# Patient Record
Sex: Male | Born: 2000 | Race: White | Hispanic: No | Marital: Single | Smoking: Former smoker
Health system: Southern US, Community
[De-identification: ages and names within clinical notes are randomized; demographics above are authoritative.]

## PROBLEM LIST (undated history)

## (undated) DIAGNOSIS — J45909 Unspecified asthma, uncomplicated: Secondary | ICD-10-CM

## (undated) HISTORY — PX: CIRCUMCISION: SHX1350

---
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
## 2021-06-28 ENCOUNTER — Other Ambulatory Visit: Payer: Self-pay | Admitting: Orthopedic Surgery

## 2021-06-28 ENCOUNTER — Other Ambulatory Visit: Payer: Self-pay | Admitting: Hematology and Oncology

## 2021-06-28 DIAGNOSIS — G8929 Other chronic pain: Secondary | ICD-10-CM

## 2021-07-14 ENCOUNTER — Other Ambulatory Visit: Payer: Self-pay

## 2021-07-14 ENCOUNTER — Ambulatory Visit
Admission: RE | Admit: 2021-07-14 | Discharge: 2021-07-14 | Disposition: A | Discharge status: Home / Self Care | Payer: 59 | Source: Ambulatory Visit | Attending: Orthopedic Surgery | Admitting: Orthopedic Surgery

## 2021-07-14 DIAGNOSIS — G8929 Other chronic pain: Secondary | ICD-10-CM

## 2021-07-14 DIAGNOSIS — M25562 Pain in left knee: Secondary | ICD-10-CM

## 2021-07-30 ENCOUNTER — Other Ambulatory Visit: Payer: Self-pay | Admitting: Nurse Practitioner

## 2021-07-30 ENCOUNTER — Other Ambulatory Visit: Payer: Self-pay

## 2021-07-30 ENCOUNTER — Ambulatory Visit
Admission: RE | Admit: 2021-07-30 | Discharge: 2021-07-30 | Disposition: A | Discharge status: Home / Self Care | Payer: No Typology Code available for payment source | Source: Ambulatory Visit | Attending: Nurse Practitioner | Admitting: Nurse Practitioner

## 2021-07-30 DIAGNOSIS — Z021 Encounter for pre-employment examination: Secondary | ICD-10-CM

## 2021-11-05 ENCOUNTER — Emergency Department (HOSPITAL_BASED_OUTPATIENT_CLINIC_OR_DEPARTMENT_OTHER): Payer: No Typology Code available for payment source

## 2021-11-05 ENCOUNTER — Other Ambulatory Visit: Payer: Self-pay

## 2021-11-05 ENCOUNTER — Emergency Department (HOSPITAL_BASED_OUTPATIENT_CLINIC_OR_DEPARTMENT_OTHER)
Admission: EM | Admit: 2021-11-05 | Discharge: 2021-11-05 | Disposition: A | Discharge status: Home / Self Care | Payer: No Typology Code available for payment source | Attending: Emergency Medicine | Admitting: Emergency Medicine

## 2021-11-05 ENCOUNTER — Encounter (HOSPITAL_BASED_OUTPATIENT_CLINIC_OR_DEPARTMENT_OTHER): Payer: Self-pay | Admitting: Pediatrics

## 2021-11-05 DIAGNOSIS — S0083XA Contusion of other part of head, initial encounter: Secondary | ICD-10-CM | POA: Insufficient documentation

## 2021-11-05 DIAGNOSIS — J45909 Unspecified asthma, uncomplicated: Secondary | ICD-10-CM | POA: Diagnosis not present

## 2021-11-05 DIAGNOSIS — W228XXA Striking against or struck by other objects, initial encounter: Secondary | ICD-10-CM | POA: Insufficient documentation

## 2021-11-05 DIAGNOSIS — S060X0A Concussion without loss of consciousness, initial encounter: Secondary | ICD-10-CM | POA: Insufficient documentation

## 2021-11-05 DIAGNOSIS — S0990XA Unspecified injury of head, initial encounter: Secondary | ICD-10-CM | POA: Diagnosis present

## 2021-11-05 MED ORDER — ACETAMINOPHEN 325 MG PO TABS
650.0000 mg | ORAL_TABLET | Freq: Once | ORAL | Status: AC
  Administered 2021-11-05: 650 mg via ORAL
  Filled 2021-11-05: qty 2

## 2021-11-05 NOTE — Discharge Instructions (Signed)
You were seen in the ER today for your headache and head injury. You likely have a concussion. You may take Tylenol 650 mg every 4 hours as needed for headache. It is very important that you rest over the next 48 hours. You should also not participate in any exercise or sports for at least 7 days and until you have no symptoms including headache, dizziness, nausea, or vomiting. Once your symptoms have resolved and you have gone at least 7 days following your injury, you may gradually return to light exercise as directed by your regular primary care provider. Return to the emergency department for repeat evaluation if you develop more than 2 episodes of vomiting, worsening headache despite use of Tylenol, difficulties with speech or balance, or other new concerns. ? ?
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
## 2021-11-05 NOTE — ED Notes (Signed)
Patient transported ambulatory to CT ?
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
## 2021-11-05 NOTE — ED Notes (Signed)
RN provided AVS using Teachback Method. Patient verbalizes understanding of Discharge Instructions. Opportunity for Questioning and Answers were provided by RN. Patient Discharged from ED ambulatory to Home with family.  

## 2023-02-03 ENCOUNTER — Emergency Department (HOSPITAL_BASED_OUTPATIENT_CLINIC_OR_DEPARTMENT_OTHER): Payer: 59 | Admitting: Radiology

## 2023-02-03 ENCOUNTER — Emergency Department (HOSPITAL_BASED_OUTPATIENT_CLINIC_OR_DEPARTMENT_OTHER)
Admission: EM | Admit: 2023-02-03 | Discharge: 2023-02-03 | Disposition: A | Discharge status: Home / Self Care | Payer: 59 | Attending: Emergency Medicine | Admitting: Emergency Medicine

## 2023-02-03 ENCOUNTER — Encounter (HOSPITAL_BASED_OUTPATIENT_CLINIC_OR_DEPARTMENT_OTHER): Payer: Self-pay

## 2023-02-03 ENCOUNTER — Other Ambulatory Visit: Payer: Self-pay

## 2023-02-03 DIAGNOSIS — M25511 Pain in right shoulder: Secondary | ICD-10-CM | POA: Diagnosis present

## 2023-02-03 DIAGNOSIS — S43401A Unspecified sprain of right shoulder joint, initial encounter: Secondary | ICD-10-CM | POA: Insufficient documentation

## 2023-02-03 DIAGNOSIS — Y92410 Unspecified street and highway as the place of occurrence of the external cause: Secondary | ICD-10-CM | POA: Insufficient documentation
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
## 2023-02-03 NOTE — Discharge Instructions (Signed)
You have been seen today for your complaint of right shoulder pain. Your imaging was reassuring. Your discharge medications include Alternate tylenol and ibuprofen for pain. You may alternate these every 4 hours. You may take up to 800 mg of ibuprofen at a time and up to 1000 mg of tylenol. Home care instructions are as follows:   Rest and ice the shoulder multiple times per day Follow up with: Dr. Shon Baton. He is an Investment banker, operational. Call to schedule a follow up visit. Please seek immediate medical care if you develop any of the following symptoms: You cannot move your arm or shoulder. You develop severe numbness or tingling in your arm, hand, or fingers. Your arm, hand, or fingers feel cold and turn blue, white, or gray. At this time there does not appear to be the presence of an emergent medical condition, however there is always the potential for conditions to change. Please read and follow the below instructions.  Do not take your medicine if  develop an itchy rash, swelling in your mouth or lips, or difficulty breathing; call 911 and seek immediate emergency medical attention if this occurs.  You may review your lab tests and imaging results in their entirety on your MyChart account.  Please discuss all results of fully with your primary care provider and other specialist at your follow-up visit.  Note: Portions of this text may have been transcribed using voice recognition software. Every effort was made to ensure accuracy; however, inadvertent computerized transcription errors may still be present.

## 2023-02-03 NOTE — ED Notes (Signed)
Discharge paperwork given and verbally understood. 

## 2023-08-13 IMAGING — CR DG CHEST 1V
1 series · 1 of 1 positions shown · non-contrast
Comparison: Chest x-ray 05/30/2020.

CLINICAL DATA: 20-year-old male under pre-employment evaluation for
the fire department.

EXAM:
CHEST  1 VIEW

[w chest pa]
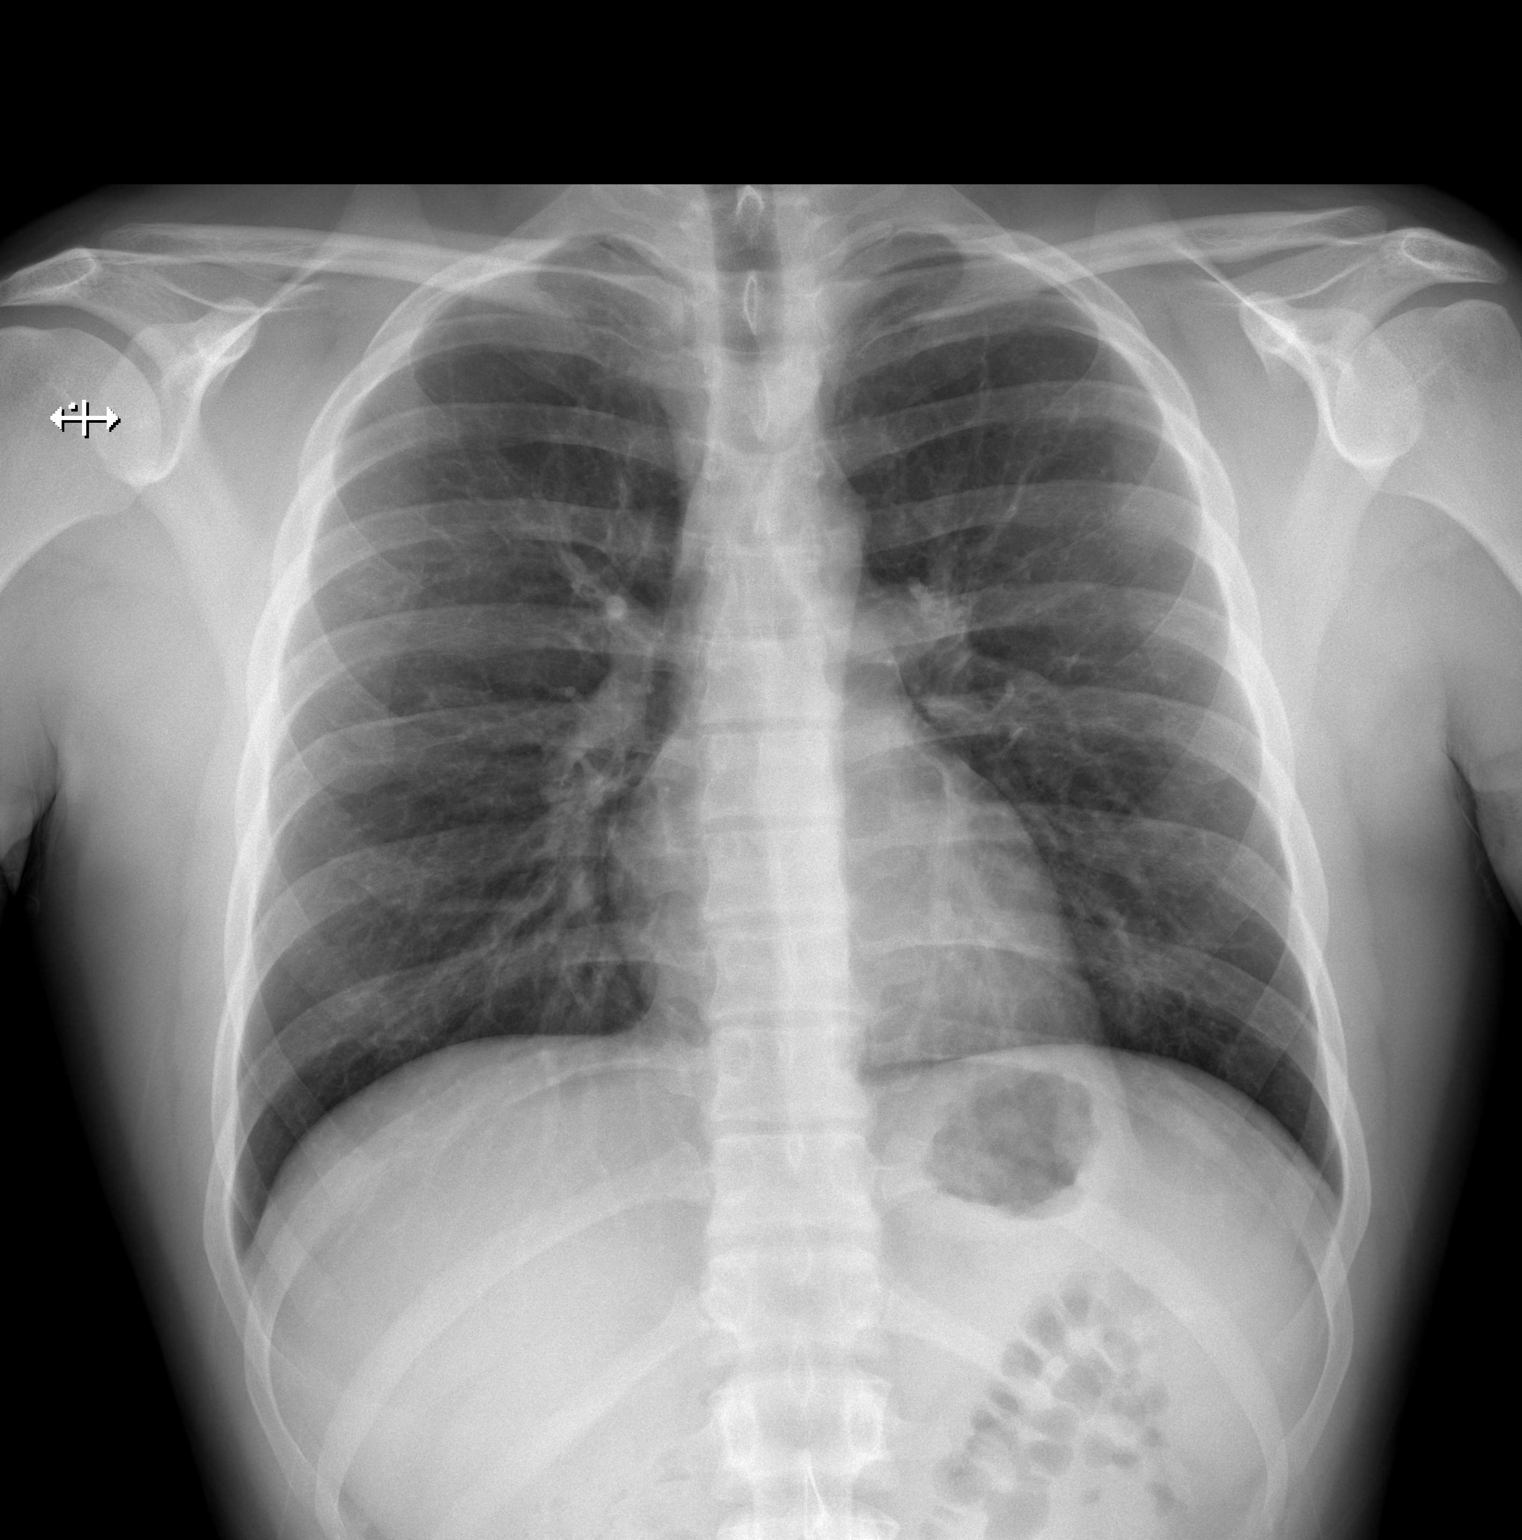

[1 of 1 positions shown; findings below may reference images not displayed]

FINDINGS: Lung volumes are normal. No consolidative airspace disease. No
pleural effusions. No pneumothorax. No pulmonary nodule or mass
noted. Pulmonary vasculature and the cardiomediastinal silhouette
are within normal limits.
IMPRESSION: No radiographic evidence of acute cardiopulmonary disease.

## 2023-10-13 ENCOUNTER — Other Ambulatory Visit (HOSPITAL_COMMUNITY): Payer: Self-pay | Admitting: Orthopedic Surgery

## 2023-10-13 ENCOUNTER — Encounter (HOSPITAL_BASED_OUTPATIENT_CLINIC_OR_DEPARTMENT_OTHER): Payer: Self-pay

## 2023-10-13 ENCOUNTER — Ambulatory Visit (HOSPITAL_BASED_OUTPATIENT_CLINIC_OR_DEPARTMENT_OTHER): Admission: RE | Admit: 2023-10-13 | Payer: 59 | Source: Ambulatory Visit

## 2023-10-13 DIAGNOSIS — M79671 Pain in right foot: Secondary | ICD-10-CM

## 2024-03-18 ENCOUNTER — Emergency Department

## 2024-03-18 ENCOUNTER — Other Ambulatory Visit: Payer: Self-pay

## 2024-03-18 ENCOUNTER — Encounter: Payer: Self-pay | Admitting: *Deleted

## 2024-03-18 ENCOUNTER — Emergency Department
Admission: EM | Admit: 2024-03-18 | Discharge: 2024-03-18 | Disposition: A | Discharge status: Home / Self Care | Attending: Emergency Medicine | Admitting: Emergency Medicine

## 2024-03-18 DIAGNOSIS — S61213S Laceration without foreign body of left middle finger without damage to nail, sequela: Secondary | ICD-10-CM

## 2024-03-18 DIAGNOSIS — W28XXXA Contact with powered lawn mower, initial encounter: Secondary | ICD-10-CM | POA: Diagnosis not present

## 2024-03-18 DIAGNOSIS — S6992XA Unspecified injury of left wrist, hand and finger(s), initial encounter: Secondary | ICD-10-CM | POA: Diagnosis present

## 2024-03-18 DIAGNOSIS — S62633B Displaced fracture of distal phalanx of left middle finger, initial encounter for open fracture: Secondary | ICD-10-CM | POA: Diagnosis not present

## 2024-03-18 DIAGNOSIS — S61215A Laceration without foreign body of left ring finger without damage to nail, initial encounter: Secondary | ICD-10-CM | POA: Diagnosis not present

## 2024-03-18 DIAGNOSIS — S61213A Laceration without foreign body of left middle finger without damage to nail, initial encounter: Secondary | ICD-10-CM | POA: Insufficient documentation

## 2024-03-18 DIAGNOSIS — S62639B Displaced fracture of distal phalanx of unspecified finger, initial encounter for open fracture: Secondary | ICD-10-CM

## 2024-03-18 MED ORDER — LIDOCAINE HCL (PF) 1 % IJ SOLN
5.0000 mL | Freq: Once | INTRAMUSCULAR | Status: AC
  Administered 2024-03-18: 5 mL
  Filled 2024-03-18: qty 5

## 2024-03-18 MED ORDER — CEPHALEXIN 500 MG PO CAPS
500.0000 mg | ORAL_CAPSULE | Freq: Once | ORAL | Status: AC
  Administered 2024-03-18: 500 mg via ORAL
  Filled 2024-03-18: qty 1

## 2024-03-18 MED ORDER — HYDROCODONE-ACETAMINOPHEN 5-325 MG PO TABS
2.0000 | ORAL_TABLET | Freq: Once | ORAL | Status: AC
  Administered 2024-03-18: 2 via ORAL
  Filled 2024-03-18: qty 2

## 2024-03-18 MED ORDER — IBUPROFEN 600 MG PO TABS: 600.0000 mg | ORAL_TABLET | Freq: Three times a day (TID) | ORAL | 0 refills | Status: AC | PRN

## 2024-03-18 MED ORDER — HYDROCODONE-ACETAMINOPHEN 5-325 MG PO TABS: 1.0000 | ORAL_TABLET | Freq: Three times a day (TID) | ORAL | 0 refills | Status: AC | PRN

## 2024-03-18 MED ORDER — CEPHALEXIN 500 MG PO CAPS: 500.0000 mg | ORAL_CAPSULE | Freq: Three times a day (TID) | ORAL | 0 refills | Status: AC

## 2024-03-18 NOTE — ED Triage Notes (Signed)
 Pt ambulatory to triage.  Pt has laceration to left 3rd and 4th fingers.  Pt was working on a lawnmower and cut the tips of fingers.  Bleeding controlled  pt alert.

## 2024-03-18 NOTE — ED Provider Notes (Signed)
 [facility] Provider Note    Event Date/Time   First MD Initiated Contact with Patient 03/18/24 2106     (approximate)   History   Laceration (/)    HPI  Brett Willis is a 23 y.o. male     with no significant past medical history who presents to the ED complaining of laceration. According to the patient, patient was working on a lawnmower and cut his left 3rd and 4th fingers.  Tdap lest than 5 years.  Patient is here with his mother    Patient Active Problem List   Diagnosis Date Noted   Dark stools 11/23/2018   Poor concentration 08/07/2018   Frequent headaches 01/10/2016   History of multiple concussions 01/10/2016   Anxiety state 01/10/2016     ROS: Patient currently denies any vision changes, tinnitus, difficulty speaking, facial droop, sore throat, chest pain, shortness of breath, abdominal pain, nausea/vomiting/diarrhea, dysuria, or weakness/numbness/paresthesias in any extremity   Physical Exam   Triage Vital Signs: ED Triage Vitals  Encounter Vitals Group     BP 03/18/24 1927 (!) 144/100     Girls Systolic BP Percentile --      Girls Diastolic BP Percentile --      Boys Systolic BP Percentile --      Boys Diastolic BP Percentile --      Pulse Rate 03/18/24 1927 79     Resp 03/18/24 1927 20     Temp 03/18/24 1927 98.4 F (36.9 C)     Temp Source 03/18/24 1927 Oral     SpO2 03/18/24 1927 100 %     Weight 03/18/24 1926 170 lb (77.1 kg)     Height 03/18/24 1926 5' 8 (1.727 m)     Head Circumference --      Peak Flow --      Pain Score 03/18/24 1926 10     Pain Loc --      Pain Education --      Exclude from Growth Chart --     Most recent vital signs: Vitals:   03/18/24 1927 03/18/24 2238  BP: (!) 144/100 (!) 130/90  Pulse: 79 78  Resp: 20 20  Temp: 98.4 F (36.9 C) (!) 97.4 F (36.3 C)  SpO2: 100% 98%     Physical Exam Vitals and nursing note reviewed.  In triage patient was hypertensive  Constitutional:       General: Awake and alert. No acute distress.    Appearance: Normal appearance. The patient is normal weight.      Able to speak in complete sentences without cough or dyspnea  HENT:     Head: Normocephalic and atraumatic.     Mouth: Mucous membranes are moist.  Eyes:     General: PERRL. Normal EOMs          Conjunctiva/sclera: Conjunctivae normal.  Nose No congestion/rhinorrhea  CV:                  Good peripheral perfusion.  Regular rate and rhythm  Resp:               Normal effort.  Equal breath sounds bilaterally.  Abd:                 No distention.  Soft, nontender.  No rebound or guarding.  Musculoskeletal:        General: No swelling. Normal range of motion Left hand third finger: Avulsion of the skin tip  of the finger.  Nail is intact.  Mild active bleeding.  No signs of infection. Left hand fourth finger: Laceration on the tip of the finger.  Nail is intact.  Mild bleeding.  No signs of infection  Skin:    General: Skin is warm and dry.     Capillary Refill: Capillary refill takes less than 2 seconds.     Findings: No rash.  Neurological:     Mental Status: The patient is awake and alert. MAE spontaneously. No gross focal neurologic deficits are appreciated.  Psychiatric Mood and affect are normal. Speech and behavior are normal.   ED Results / Procedures / Treatments   Labs (all labs ordered are listed, but only abnormal results are displayed) Labs Reviewed - No data to display   EKG  RADIOLOGY     PROCEDURES:  Critical Care performed:   Procedures   MEDICATIONS ORDERED IN ED: Medications  lidocaine  (PF) (XYLOCAINE ) 1 % injection 5 mL (has no administration in time range)  cephALEXin  (KEFLEX ) capsule 500 mg (has no administration in time range)  HYDROcodone -acetaminophen  (NORCO/VICODIN) 5-325 MG per tablet 2 tablet (2 tablets Oral Given 03/18/24 2231)   Clinical Course as of 03/18/24 2342  Thu Mar 18, 2024  2325 DG Hand Complete Left Soft tissue  avulsion involving the distal third digit with associated distal tuft fracture. 2. Suspected soft tissue avulsion involving the distal fourth digit, without osseous abnormality.   [AE]    Clinical Course User Index [AE] Janit Kast, PA-C    IMPRESSION / MDM / ASSESSMENT AND PLAN / ED COURSE  I reviewed the triage vital signs and the nursing notes.  Differential diagnosis includes, but is not limited to, avulsion of the skin, laceration, fracture  Patient's presentation is most consistent with acute complicated illness / injury requiring diagnostic workup.     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  At physical exam there is evidence of avulsion of the skin in the tip of the third left finger with intact nail.  A laceration of the tip of the fourth finger Patient's diagnosis is consistent with laceration of left 3rd and 4th fingers, distal third digit tuft open fracture.  I independently reviewed and interpreted imaging and agree with radiologists findings.  I did not order any labs, physical exam is reassuring. I did review the patient's allergies and medications.Tdap is up-to-date. The patient is in stable and satisfactory condition for discharge home  Patient will be discharged home with prescriptions for ibuprofen , Keflex  Norco.. Patient is to follow up with PCP and wound center as needed or otherwise directed. Patient is given ED precautions to return to the ED for any worsening or new symptoms. Discussed plan of care with patient, answered all of patient's questions, Patient agreeable to plan of care. Advised patient to take medications according to the instructions on the label. Discussed possible side effects of new medications. Patient verbalized understanding.   FINAL CLINICAL IMPRESSION(S) / ED DIAGNOSES   Final diagnoses:  Laceration of left middle finger without foreign body without  damage to nail, sequela  Laceration of left ring finger without foreign body without damage to nail, initial encounter  Open fracture of tuft of distal phalanx of finger     Rx / DC Orders   ED Discharge Orders          Ordered    cephALEXin  (KEFLEX ) 500  MG capsule  3 times daily        03/18/24 2334    ibuprofen  (ADVIL ) 600 MG tablet  Every 8 hours PRN        03/18/24 2334    HYDROcodone -acetaminophen  (NORCO/VICODIN) 5-325 MG tablet  3 times daily PRN        03/18/24 2341             Note:  This document was prepared using Dragon voice recognition software and may include unintentional dictation errors.   Janit Kast, PA-C 03/18/24 2342    Willo Dunnings, MD 03/19/24 [id number]

## 2024-03-18 NOTE — Discharge Instructions (Addendum)
 You have been diagnosed with laceration of the left 3rd and 4th finger, third digit distal tuft open fracture.  Do not submerge your hand in water.  Please call the wound center and make an appointment for Monday.  You can take ibuprofen  600 mg every 8 hours as needed for pain.  Please take Keflex  1 tablet by mouth every 8 hours for 7 days .Please come back to ED or go to your PCP if symptoms or symptoms worsen.

## 2024-03-22 NOTE — Progress Notes (Signed)
 Brett Willis is a 23 y.o. male who is a new patient in clinic to establish care.  Patient presents as new patient for annual exam as well as wound care needs. 3 days ago he went to ER after he reached under a lawn mower and lacerated the tips of his left, 3rd and 4th, digits with avulsion fraction of the 3rd. Wound care was recommended but when he called they informed he would need a referral and can't get him in until September. Patient notes some pain but would like to return to work as well as obtain wound care instructions.  Upon further history, patient notes he has 13% disability with history of right bicep repair but is cleared for full duty related to work. He also notes a history of at least 11 concussions (both sustained in athletics as well as recreationally). He has persistent headaches in different locations, sometimes multiple times weekly, and primarily in the right front of his head. He notes light sensitivity. Describes as occasionally throbbing and other times persistent pressure. He'll take extra strength acetaminophen  which is somewhat beneficial.   Hx of gastritis, avoids ibuprofen .  Uses nicotine vape and pouches daily. Unknown quantity.   Patient denies concern, other than wound, that he wants addressed today.     Preventive Care:  - Dentist: patient notes last visit was about 8 years ago. Discussed recommendations and routine hygiene as patient notes bleeding with brushing which does not occur regularly.   - Ophthalmology: contacts, annual visits, no concerns  - Colonoscopy: no family hx, defer  - Exercise: defer  - Healthy food choices: defer  - Tdap: up to date  Patient Active Problem List  Diagnosis  . Frequent headaches  . History of concussion  . Annual physical exam    No past medical history on file.  Past Surgical History:  Procedure Laterality Date  . REPAIR TENDON/MUSCLE BICEPS Right 2024    Family History  Problem Relation Name Age of Onset  .  No Known Problems Mother    . No Known Problems Father    . No Known Problems Maternal Grandmother    . No Known Problems Maternal Grandfather    . No Known Problems Paternal Grandmother    . No Known Problems Paternal Grandfather      Social History   Socioeconomic History  . Marital status: Single  . Number of children: 1  Tobacco Use  . Smoking status: Never  . Smokeless tobacco: Current  Vaping Use  . Vaping status: Every Day  Substance and Sexual Activity  . Alcohol use: Never  . Drug use: Never  . Sexual activity: Yes    Partners: Female   Social Drivers of Health   Housing Stability: Unknown (03/22/2024)   Housing Stability Vital Sign   . Homeless in the Last Year: No    Current Outpatient Medications  Medication Sig Dispense Refill  . cephalexin  (KEFLEX ) 500 MG capsule Take 500 mg by mouth 3 (three) times daily    . ibuprofen  (MOTRIN ) 600 MG tablet Take 600 mg by mouth every 6 (six) hours as needed     No current facility-administered medications for this visit.    No Known Allergies  No results found for this or any previous visit.  BP Readings from Last 3 Encounters:  03/22/24 130/80    Wt Readings from Last 3 Encounters:  03/22/24 80.5 kg (177 lb 6.4 oz)    Body mass index is 26.78 kg/m.   Review  of Systems:  Review of Systems  Constitutional:  Negative for diaphoresis, malaise/fatigue and weight loss.  Eyes:  Negative for blurred vision, double vision and photophobia.  Respiratory:  Negative for cough, shortness of breath and wheezing.   Cardiovascular:  Negative for chest pain, palpitations and leg swelling.  Gastrointestinal:  Negative for constipation and diarrhea.  Musculoskeletal:  Negative for joint pain and myalgias.  Skin:  Negative for itching and rash.  Neurological:  Positive for headaches. Negative for dizziness.  Psychiatric/Behavioral:  The patient does not have insomnia.       Physical Exam:  Vitals:   03/22/24 1354   BP: 130/80  Pulse: 84  SpO2: 99%  Weight: 80.5 kg (177 lb 6.4 oz)  Height: 173.4 cm (5' 8.25)    Physical Exam Constitutional:      General: He is not in acute distress.    Appearance: Normal appearance.  HENT:     Head: Normocephalic and atraumatic.     Right Ear: Tympanic membrane, ear canal and external ear normal.     Left Ear: Tympanic membrane, ear canal and external ear normal.     Nose: Nose normal.     Mouth/Throat:     Lips: Pink.     Mouth: Mucous membranes are moist.     Tongue: Tongue does not deviate from midline.     Pharynx: Oropharynx is clear. Uvula midline.  Eyes:     General: Lids are normal. Vision grossly intact. Gaze aligned appropriately.     Extraocular Movements: Extraocular movements intact.     Pupils: Pupils are equal, round, and reactive to light.  Cardiovascular:     Rate and Rhythm: Normal rate and regular rhythm.     Heart sounds: S1 normal and S2 normal.  Pulmonary:     Effort: Pulmonary effort is normal.     Breath sounds: Normal breath sounds.  Musculoskeletal:     Cervical back: Full passive range of motion without pain.     Right lower leg: No edema.     Left lower leg: No edema.  Lymphadenopathy:     Cervical: No cervical adenopathy.  Neurological:     General: No focal deficit present.     Mental Status: He is alert and oriented to person, place, and time.  Psychiatric:        Mood and Affect: Mood and affect normal.        Behavior: Behavior is cooperative.          Assessment/Plan:  Annual physical exam Reviewed routine screenings and recommendations. Patient fasting, screening labs to be obtained today. Discussed healthy lifestyle and safety.   Laceration of finger of left hand without foreign body with damage to nail Dressing removed today, required soaking due to dried blood. Healing wound with dark purple/black hard discharge circumferential to wounds. Question dried blood versus necrotic skin. No apparent  infection. On Keflex . Cleansed with saline, applied topical antibiotic ointment, dressed. Refer to wound care. See pictures under media.   Frequent headaches Responding to OTC but frequent and severe in nature. Encouraged follow up for further discussion and treatment options.     Follow-up with results     Margaretann Seip, NP

## 2024-04-07 ENCOUNTER — Ambulatory Visit: Admitting: Physician Assistant

## 2024-04-20 ENCOUNTER — Ambulatory Visit: Admitting: Physician Assistant

## 2024-07-22 ENCOUNTER — Emergency Department (HOSPITAL_COMMUNITY)

## 2024-07-22 ENCOUNTER — Emergency Department (HOSPITAL_COMMUNITY): Admission: EM | Admit: 2024-07-22 | Discharge: 2024-07-22 | Disposition: A | Discharge status: Home / Self Care

## 2024-07-22 DIAGNOSIS — R531 Weakness: Secondary | ICD-10-CM

## 2024-07-22 DIAGNOSIS — R519 Headache, unspecified: Secondary | ICD-10-CM | POA: Diagnosis not present

## 2024-07-22 DIAGNOSIS — R410 Disorientation, unspecified: Secondary | ICD-10-CM

## 2024-07-22 DIAGNOSIS — E876 Hypokalemia: Secondary | ICD-10-CM | POA: Diagnosis not present

## 2024-07-22 DIAGNOSIS — R4701 Aphasia: Secondary | ICD-10-CM | POA: Diagnosis not present

## 2024-07-22 DIAGNOSIS — G43501 Persistent migraine aura without cerebral infarction, not intractable, with status migrainosus: Secondary | ICD-10-CM | POA: Diagnosis present

## 2024-07-22 LAB — COMPREHENSIVE METABOLIC PANEL WITH GFR
ALT: 24 U/L (ref 0–44)
AST: 28 U/L (ref 15–41)
Albumin: 4.6 g/dL (ref 3.5–5.0)
Alkaline Phosphatase: 87 U/L (ref 38–126)
Anion gap: 14 (ref 5–15)
BUN: 9 mg/dL (ref 6–20)
CO2: 22 mmol/L (ref 22–32)
Calcium: 8.9 mg/dL (ref 8.9–10.3)
Chloride: 101 mmol/L (ref 98–111)
Creatinine, Ser: 1.14 mg/dL (ref 0.61–1.24)
GFR, Estimated: >60 mL/min (ref >60)
Glucose, Bld: 108 mg/dL — ABNORMAL HIGH (ref 70–99)
Potassium: 3.3 mmol/L — ABNORMAL LOW (ref 3.5–5.1)
Sodium: 137 mmol/L (ref 135–145)
Total Bilirubin: 1.4 mg/dL — ABNORMAL HIGH (ref 0.0–1.2)
Total Protein: 7.1 g/dL (ref 6.5–8.1)

## 2024-07-22 LAB — URINALYSIS, ROUTINE W REFLEX MICROSCOPIC
Bilirubin Urine: NEGATIVE
Glucose, UA: NEGATIVE mg/dL
Hgb urine dipstick: NEGATIVE
Ketones, ur: 20 mg/dL — AB
Leukocytes,Ua: NEGATIVE
Nitrite: NEGATIVE
Protein, ur: NEGATIVE mg/dL
Specific Gravity, Urine: 1.045 — ABNORMAL HIGH (ref 1.005–1.030)
pH: 6 (ref 5.0–8.0)

## 2024-07-22 LAB — RAPID URINE DRUG SCREEN, HOSP PERFORMED
Amphetamines: NOT DETECTED
Barbiturates: NOT DETECTED
Benzodiazepines: NOT DETECTED
Cocaine: NOT DETECTED
Opiates: NOT DETECTED
Tetrahydrocannabinol: NOT DETECTED

## 2024-07-22 LAB — DIFFERENTIAL
Abs Immature Granulocytes: 0.03 K/uL (ref 0.00–0.07)
Basophils Absolute: 0.1 K/uL (ref 0.0–0.1)
Basophils Relative: 1 %
Eosinophils Absolute: 0 K/uL (ref 0.0–0.5)
Eosinophils Relative: 0 %
Immature Granulocytes: 0 %
Lymphocytes Relative: 14 %
Lymphs Abs: 1.1 K/uL (ref 0.7–4.0)
Monocytes Absolute: 0.5 K/uL (ref 0.1–1.0)
Monocytes Relative: 7 %
Neutro Abs: 5.7 K/uL (ref 1.7–7.7)
Neutrophils Relative %: 78 %

## 2024-07-22 LAB — CBC
HCT: 47.2 % (ref 39.0–52.0)
Hemoglobin: 16.9 g/dL (ref 13.0–17.0)
MCH: 30.2 pg (ref 26.0–34.0)
MCHC: 35.8 g/dL (ref 30.0–36.0)
MCV: 84.3 fL (ref 80.0–100.0)
Platelets: 312 K/uL (ref 150–400)
RBC: 5.6 MIL/uL (ref 4.22–5.81)
RDW: 11.5 % (ref 11.5–15.5)
WBC: 7.4 K/uL (ref 4.0–10.5)
nRBC: 0 % (ref 0.0–0.2)

## 2024-07-22 LAB — I-STAT CHEM 8, ED
BUN: 8 mg/dL (ref 6–20)
Calcium, Ion: 0.99 mmol/L — ABNORMAL LOW (ref 1.15–1.40)
Chloride: 101 mmol/L (ref 98–111)
Creatinine, Ser: 1.2 mg/dL (ref 0.61–1.24)
Glucose, Bld: 105 mg/dL — ABNORMAL HIGH (ref 70–99)
HCT: 47 % (ref 39.0–52.0)
Hemoglobin: 16 g/dL (ref 13.0–17.0)
Potassium: 3.3 mmol/L — ABNORMAL LOW (ref 3.5–5.1)
Sodium: 139 mmol/L (ref 135–145)
TCO2: 24 mmol/L (ref 22–32)

## 2024-07-22 LAB — CBG MONITORING, ED: Glucose-Capillary: 110 mg/dL — ABNORMAL HIGH (ref 70–99)

## 2024-07-22 LAB — PROTIME-INR
INR: 1.2 (ref 0.8–1.2)
Prothrombin Time: 16 s — ABNORMAL HIGH (ref 11.4–15.2)

## 2024-07-22 LAB — APTT: aPTT: 31 s (ref 24–36)

## 2024-07-22 LAB — ETHANOL: Alcohol, Ethyl (B): <15 mg/dL (ref <15)

## 2024-07-22 MED ORDER — SODIUM CHLORIDE 0.9% FLUSH
3.0000 mL | Freq: Once | INTRAVENOUS | Status: AC
  Administered 2024-07-22: 3 mL via INTRAVENOUS

## 2024-07-22 MED ORDER — IOHEXOL 350 MG/ML SOLN
100.0000 mL | Freq: Once | INTRAVENOUS | Status: AC | PRN
  Administered 2024-07-22: 100 mL via INTRAVENOUS

## 2024-07-22 MED ORDER — LACTATED RINGERS IV BOLUS
1000.0000 mL | Freq: Once | INTRAVENOUS | Status: AC
  Administered 2024-07-22: 1000 mL via INTRAVENOUS

## 2024-07-22 MED ORDER — PROCHLORPERAZINE EDISYLATE 10 MG/2ML IJ SOLN
10.0000 mg | Freq: Once | INTRAMUSCULAR | Status: AC
  Administered 2024-07-22: 10 mg via INTRAVENOUS
  Filled 2024-07-22: qty 2

## 2024-07-22 MED ORDER — DIPHENHYDRAMINE HCL 50 MG/ML IJ SOLN
50.0000 mg | Freq: Once | INTRAMUSCULAR | Status: AC
  Administered 2024-07-22: 50 mg via INTRAVENOUS
  Filled 2024-07-22: qty 1

## 2024-07-22 NOTE — Code Documentation (Signed)
 Responded to Code Stroke called at 1911 for R sided weakness, R facial droop, and aphasia, LSN-1430. Pt arrived at 1930, CBG-110, NIH-3 for LOC questions. RLE drift, and aphasia. CT head negative for acute changes. CTA/CTP-no LVO. TNK not given-pt outside window. Plan-migraine cocktail. Please complete VS/neuro checks(including NIH) q2h x 12h, then q4h.

## 2024-07-22 NOTE — ED Provider Notes (Signed)
 [place] EMERGENCY DEPARTMENT AT [facility] Provider Note   CSN: [id number] Arrival date & time: 07/22/24  8069  An emergency department physician performed an initial assessment on this suspected stroke patient at 67.  Patient presents with: Code Stroke   Brett Willis is a 23 y.o. male. Hx of anxiety presenting with code stroke with right-sided hemiparesis, aphasia starting at 2:30 PM today.  History per EMS, some history from patient.  Last known well was 2:30 PM.  This was reported from family.  Reports right-sided hemiparesis, right-sided facial droop, and aphasia.  With EMS no medications given, facial droop has resolved, weakness has improved in the right upper and lower extremity.  Aphasia is also resolved, patient is able to answer some questions however very slow to response.  Denies any episodes of vomiting.  Patient endorses he developed a sudden onset headache today, and then developed a symptoms of right-sided facial droop and right-sided hemiparesis.  Sinew to have a headache on arrival.  Denies fever or recent illnesses.   HPI     Prior to Admission medications   Medication Sig Start Date End Date Taking? Authorizing Provider  amphetamine -dextroamphetamine (ADDERALL XR) 10 MG 24 hr capsule Take 1 capsule (10 mg total) by mouth daily. Take it in a.m. 08/07/18   Corinthia Blossom, MD  diclofenac  Sodium (VOLTAREN ) 1 % GEL Apply 2 g topically 4 (four) times daily. 09/13/20   Edelmiro Leash, MD  dicyclomine  (BENTYL ) 10 MG capsule Take 1 capsule (10 mg total) by mouth 4 (four) times daily -  before meals and at bedtime for 5 days. 05/31/20 06/05/20  Angelena Smalls, MD  metoCLOPramide  (REGLAN ) 10 MG tablet Take 1 tablet (10 mg total) by mouth every 8 (eight) hours as needed (headache and/or nausea). 05/31/20 05/31/21  Angelena Smalls, MD    Allergies: Patient has no known allergies.    Review of Systems  Updated Vital Signs BP 123/89   Pulse 86   Temp 98 F  (36.7 C) (Oral)   Resp 18   Wt 81 kg   SpO2 99%   BMI 27.15 kg/m   Physical Exam Vitals and nursing note reviewed.  Constitutional:      General: He is in acute distress.     Appearance: He is well-developed. He is ill-appearing.  HENT:     Head: Normocephalic and atraumatic.     Mouth/Throat:     Mouth: Mucous membranes are moist.     Pharynx: Oropharynx is clear.  Eyes:     Conjunctiva/sclera: Conjunctivae normal.  Cardiovascular:     Rate and Rhythm: Normal rate and regular rhythm.     Heart sounds: No murmur heard. Pulmonary:     Effort: Pulmonary effort is normal. No respiratory distress.     Breath sounds: Normal breath sounds.  Abdominal:     General: Abdomen is flat.     Palpations: Abdomen is soft.     Tenderness: There is no abdominal tenderness.  Musculoskeletal:        General: No swelling.     Cervical back: Neck supple.  Skin:    General: Skin is warm and dry.     Capillary Refill: Capillary refill takes less than 2 seconds.  Neurological:     Mental Status: He is alert and oriented to person, place, and time.     Cranial Nerves: No cranial nerve deficit.     Sensory: No sensory deficit.     Motor: Weakness present.  Coordination: Coordination normal.     Comments: 1+ pronator drift right lower extremity in comparison to left, 1+ aphasia.  NIH stroke scale of 2.  Psychiatric:        Mood and Affect: Mood normal.     (all labs ordered are listed, but only abnormal results are displayed) Labs Reviewed  PROTIME-INR - Abnormal; Notable for the following components:      Result Value   Prothrombin Time 16.0 (*)    All other components within normal limits  COMPREHENSIVE METABOLIC PANEL WITH GFR - Abnormal; Notable for the following components:   Potassium 3.3 (*)    Glucose, Bld 108 (*)    Total Bilirubin 1.4 (*)    All other components within normal limits  URINALYSIS, ROUTINE W REFLEX MICROSCOPIC - Abnormal; Notable for the following  components:   Specific Gravity, Urine 1.045 (*)    Ketones, ur 20 (*)    All other components within normal limits  I-STAT CHEM 8, ED - Abnormal; Notable for the following components:   Potassium 3.3 (*)    Glucose, Bld 105 (*)    Calcium, Ion 0.99 (*)    All other components within normal limits  CBG MONITORING, ED - Abnormal; Notable for the following components:   Glucose-Capillary 110 (*)    All other components within normal limits  APTT  CBC  DIFFERENTIAL  ETHANOL  RAPID URINE DRUG SCREEN, HOSP PERFORMED    EKG: None  Radiology: CT ANGIO HEAD NECK W WO CM W PERF (CODE STROKE) Result Date: 07/22/2024 CLINICAL DATA:  Initial evaluation for acute neuro deficit, stroke suspected. EXAM: CT ANGIOGRAPHY HEAD AND NECK CT PERFUSION BRAIN TECHNIQUE: Multidetector CT imaging of the head and neck was performed using the standard protocol during bolus administration of intravenous contrast. Multiplanar CT image reconstructions and MIPs were obtained to evaluate the vascular anatomy. Carotid stenosis measurements (when applicable) are obtained utilizing NASCET criteria, using the distal internal carotid diameter as the denominator. Multiphase CT imaging of the brain was performed following IV bolus contrast injection. Subsequent parametric perfusion maps were calculated using RAPID software. RADIATION DOSE REDUCTION: This exam was performed according to the departmental dose-optimization program which includes automated exposure control, adjustment of the mA and/or kV according to patient size and/or use of iterative reconstruction technique. CONTRAST:  OMNIPAQUE  IOHEXOL  350 MG/ML SOLN COMPARISON:  CT from earlier the same day. FINDINGS: CTA NECK FINDINGS Aortic arch: Standard branching. Imaged portion shows no evidence of aneurysm or dissection. No significant stenosis of the major arch vessel origins. Right carotid system: No evidence of dissection, stenosis (50% or greater) or occlusion.  Left carotid system: No evidence of dissection, stenosis (50% or greater) or occlusion. Vertebral arteries: No evidence of dissection, stenosis (50% or greater) or occlusion. Skeleton: No discrete or worrisome osseous lesions. Other neck: No other acute finding. Upper chest: No other acute finding. Review of the MIP images confirms the above findings CTA HEAD FINDINGS Anterior circulation: Both internal carotid arteries widely patent to the termini without stenosis. A1 segments widely patent. Normal anterior communicating artery complex. Both anterior cerebral arteries widely patent to their distal aspects without stenosis. No M1 stenosis or occlusion. Normal MCA bifurcations. Distal MCA branches well perfused and symmetric. Posterior circulation: Both V4 segments patent without significant stenosis. Both PICA patent at their origins. Basilar patent without stenosis. Superior cerebellar and posterior cerebral arteries patent bilaterally. Venous sinuses: Patent allowing for timing the contrast bolus. Anatomic variants: None significant.  No aneurysm.  Review of the MIP images confirms the above findings CT Brain Perfusion Findings: ASPECTS: 10 CBF (<30%) Volume: 0mL Perfusion (Tmax>6.0s) volume: 0mL Mismatch Volume: 0mL Infarction Location:Negative CT perfusion with no evidence for acute ischemia or other perfusion abnormality. IMPRESSION: 1. Negative CTA of the head and neck. No large vessel occlusion or other emergent finding. No hemodynamically significant or correctable stenosis. 2. Negative CT perfusion with no evidence for acute ischemia or other perfusion abnormality. These results were communicated to Dr. Arora at 8:02 pm on 07/22/2024 by text page via the [facility] messaging system. Electronically Signed   By: Morene Hoard M.D.   On: 07/22/2024 20:08   CT HEAD CODE STROKE WO CONTRAST Result Date: 07/22/2024 CLINICAL DATA:  Code stroke. Initial evaluation for acute neuro deficit, stroke suspected.  EXAM: CT HEAD WITHOUT CONTRAST TECHNIQUE: Contiguous axial images were obtained from the base of the skull through the vertex without intravenous contrast. RADIATION DOSE REDUCTION: This exam was performed according to the departmental dose-optimization program which includes automated exposure control, adjustment of the mA and/or kV according to patient size and/or use of iterative reconstruction technique. COMPARISON:  CT from 11/05/2021. FINDINGS: Brain: Cerebral volume within normal limits for patient age. No acute intracranial hemorrhage. No acute large vessel territory infarct. No mass lesion, midline shift, or mass effect. Ventricles are normal in size without hydrocephalus. No extra-axial fluid collection. Vascular: No abnormal hyperdense vessel. Skull: Scalp soft tissues demonstrate no acute abnormality. Calvarium intact. Sinuses/Orbits: Globes and orbital soft tissues within normal limits. Visualized paranasal sinuses are largely clear. No significant mastoid effusion. ASPECTS [facility] Stroke Program Early CT Score) - Ganglionic level infarction (caudate, lentiform nuclei, internal capsule, insula, M1-M3 cortex): 7 - Supraganglionic infarction (M4-M6 cortex): 3 Total score (0-10 with 10 being normal): 10 IMPRESSION: 1. Normal head CT.  No acute intracranial abnormality. 2. ASPECTS is 10. These results were communicated to Dr. Arora at 7:42 pm on 07/22/2024 by text page via the [facility] messaging system. Electronically Signed   By: Morene Hoard M.D.   On: 07/22/2024 19:46     Procedures   Medications Ordered in the ED  sodium chloride  flush (NS) 0.9 % injection 3 mL (3 mLs Intravenous Given 07/22/24 2057)  prochlorperazine  (COMPAZINE ) injection 10 mg (10 mg Intravenous Given 07/22/24 2048)  diphenhydrAMINE  (BENADRYL ) injection 50 mg (50 mg Intravenous Given 07/22/24 2049)  lactated ringers  bolus 1,000 mL (0 mLs Intravenous Stopped 07/22/24 2232)  iohexol  (OMNIPAQUE ) 350 MG/ML injection 100  mL (100 mLs Intravenous Contrast Given 07/22/24 1955)                                    Medical Decision Making Amount and/or Complexity of Data Reviewed Labs: ordered. Radiology: ordered.  Risk Prescription drug management.   Based on patient presentation, history, evaluation, high suspicion for complex migraine with associated initial concern for stroke, therefore code stroke was activated prior to arrival.  By time of presentation, patient with near complete resolution of symptoms, NIHSS 2 per discussion as well with neurology at bedside.  Imaging overall reassuring, low suspicion for intracranial bleed versus ischemic stroke versus LVO versus carotid dissection versus vertebral dissection.  Imaging is able to provide some evidence there is not appreciated central venous thrombosis.  After receiving imaging, patient received migraine cocktail, with complete resolution of symptoms including headache and any further neurologic deficits.  Discussed with neurology, agree that this is likely secondary  to a complex migraine, which patient also has a history of.  Recommended follow-up with neurology in outpatient setting.  Overall with complete resolution of symptoms with associated migraine cocktail, and patient overall clinically stable, hemodynamically stable, and afebrile throughout time in the ED, patient is overall stable for discharge.  Discussed tricked return precautions to ED, and recommendation to follow-up with neurology in outpatient setting.     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

## 2024-07-22 NOTE — Discharge Instructions (Signed)
 1 medication you  can try is  Excedrin.  He can purchase this over-the-counter.  This can sometimes do a better job of aborting migraines.   Please follow-up with neurology.  Sometimes migraines can be debilitating.  There are good medications out there that can help treat these migraines.  This is something to discuss with neurology as well as your primary care physician  If you have any kind of fever or chills, headaches, confusion, come back to the ED.  As we discussed, I have no concerns for meningitis or other infectious pathology but if you do develop a fever please come back.

## 2024-07-22 NOTE — Consult Note (Signed)
 NEUROLOGY CONSULT NOTE   Date of service: July 22, 2024 Patient Name: Brett Willis MRN:  [id number] DOB:  09-28-2000 Chief Complaint: Aphasia, confusion, right-sided numbness Requesting Provider: Simon Lavonia SAILOR, MD, Morene Barrio, MD  History of Present Illness  Brett Willis is a 23 y.o. male with initially provided history of no significant past medical history but later family arrives and gives history of migraines, presented for evaluation of confusion, aphasia, right-sided numbness and weakness. Last known well at 2:30 PM went to take a nap.  Family tried to wake him up and he appeared to be difficult to arouse.  He is complaining of a headache and was having difficulty getting his words out.  Also was somewhat weak on the right side and possibly had a facial droop on the right.  He also reported of 8 out of 10 headache. EMS was called, noticed aphasia and right-sided weakness and activated a code stroke. Speech was improving en route to the hospital  LKW: 1430 Modified rankin score: 0-Completely asymptomatic and back to baseline post- stroke IV Thrombolysis: Outside the window EVT: No ELVO  NIHSS components Score: Comment  1a Level of Conscious 0[x]  1[]  2[]  3[]      1b LOC Questions 0[]  1[x]  2[]       1c LOC Commands 0[x]  1[]  2[]       2 Best Gaze 0[x]  1[]  2[]       3 Visual 0[x]  1[]  2[]  3[]      4 Facial Palsy 0[x]  1[]  2[]  3[]      5a Motor Arm - left 0[x]  1[]  2[]  3[]  4[]  UN[]    5b Motor Arm - Right 0[x]  1[]  2[]  3[]  4[]  UN[]    6a Motor Leg - Left 0[x]  1[]  2[]  3[]  4[]  UN[]    6b Motor Leg - Right 0[]  1[x]  2[]  3[]  4[]  UN[]    7 Limb Ataxia 0[x]  1[]  2[]  UN[]      8 Sensory 0[x]  1[]  2[]  UN[]      9 Best Language 0[]  1[x]  2[]  3[]      10 Dysarthria 0[x]  1[]  2[]  UN[]      11 Extinct. and Inattention 0[x]  1[]  2[]       TOTAL: 3      ROS  Comprehensive ROS performed and pertinent positives documented in the HPI.  Past History   Past Medical History:  Diagnosis Date   Asthma      Past Surgical History:  Procedure Laterality Date   CIRCUMCISION      Family History: Family History  Problem Relation Age of Onset   Migraines Mother    Depression Father    Migraines Maternal Grandfather     Social History  reports that he has quit smoking. He has never used smokeless tobacco. He reports current drug use. Drug: Marijuana. He reports that he does not drink alcohol.  No Known Allergies  Medications   Current Facility-Administered Medications:    sodium chloride  flush (NS) 0.9 % injection 3 mL, 3 mL, Intravenous, Once, Lemly, Tatum N, MD  Current Outpatient Medications:    amphetamine -dextroamphetamine (ADDERALL XR) 10 MG 24 hr capsule, Take 1 capsule (10 mg total) by mouth daily. Take it in a.m., Disp: 30 capsule, Rfl: 0   diclofenac  Sodium (VOLTAREN ) 1 % GEL, Apply 2 g topically 4 (four) times daily., Disp: 100 g, Rfl: 0   dicyclomine  (BENTYL ) 10 MG capsule, Take 1 capsule (10 mg total) by mouth 4 (four) times daily -  before meals and at bedtime for 5 days., Disp: 20 capsule, Rfl:  0   metoCLOPramide  (REGLAN ) 10 MG tablet, Take 1 tablet (10 mg total) by mouth every 8 (eight) hours as needed (headache and/or nausea)., Disp: 12 tablet, Rfl: 0  Vitals   Vitals:   24-Jul-2024 1900 2024-07-24 1959  BP:  (!) 166/105  Pulse:  92  Resp:  16  Temp:  98.8 F (37.1 C)  SpO2:  100%  Weight: 81 kg     Body mass index is 27.15 kg/m.   Physical Exam   General Well-developed well-nourished young man in no acute distress. HEENT: Normocephalic, atraumatic Lungs are clear. CVS examination reveals regular rate and rhythm Abdomen nondistended nontender Extremities warm well-perfused Neurological exam Awake alert oriented to self.  Could not tell me the month.  Could not tell me that today is Thanksgiving. Got his age correct.  I had trouble making full sentences. Appeared somewhat encephalopathic Cranial nerves II to XII intact Motor examination with right  lower extremity mild drift, otherwise intact. Sensation intact to light touch without extinction Coordination examination reveals no dysmetria  Labs/Imaging/Neurodiagnostic studies   CBC:  Recent Labs  Lab 2024-07-24 1937 07/24/2024 1938  WBC 7.4  --   NEUTROABS 5.7  --   HGB 16.9 16.0  HCT 47.2 47.0  MCV 84.3  --   PLT 312  --    Basic Metabolic Panel:  Lab Results  Component Value Date   NA 139 07-24-24   K 3.3 (L) July 24, 2024   CO2 22 07/24/24   GLUCOSE 105 (H) Jul 24, 2024   BUN 8 24-Jul-2024   CREATININE 1.20 24-Jul-2024   CALCIUM 8.9 07/24/24   GFRNONAA >60 2024/07/24   Alcohol Level     Component Value Date/Time   ETH <15 24-Jul-2024 1937   INR  Lab Results  Component Value Date   INR 1.2 2024-07-24   APTT  Lab Results  Component Value Date   APTT 31 07-24-24   CT Head without contrast(Personally reviewed): No acute findings  CT angio Head and Neck with contrast(Personally reviewed): No ELVO   ASSESSMENT   Brett Willis is a 23 y.o. male with no significant past medical history initially reported and then family reporting history of migraines, presenting for evaluation of sudden onset of confusion and aphasia right-sided weakness that has been resolving. CT head with no acute findings.  No ELVO on CT angio head and neck. Symptoms started with a headache that he describes 8/10 in intensity. Given rapidly improving symptoms as well as prior history of migraines and temporal association with a headache this time, this is more likely to reflect a complex migraine phenomenology rather than a stroke.  Impression: Strokelike symptoms, possibly related to complicated migraine  RECOMMENDATIONS  Migraine cocktail at this time Frequent neurochecks If the migraine cocktail is not effective, consider repeating and also then consider getting an MRI to rule out a structural cause. I would recommend outpatient neurology referral-preferably to a headache  specialist. Plan discussed with Dr. Arlee, EDP.  ______________________________________________________________________    Signed, Eligio Lav, MD [facility]

## 2024-07-22 NOTE — ED Triage Notes (Signed)
 PT arrived via [facility], LKW 1430, aphasic, RLE drift, pt unable to give correct month. NIH of 3.

## 2024-07-23 ENCOUNTER — Telehealth: Admitting: Family Medicine

## 2024-07-23 NOTE — Progress Notes (Signed)
 Pt did not show for visit [facility]

## 2024-07-24 ENCOUNTER — Emergency Department (HOSPITAL_COMMUNITY)
Admission: EM | Admit: 2024-07-24 | Discharge: 2024-07-24 | Disposition: A | Discharge status: Home / Self Care | Attending: Emergency Medicine | Admitting: Emergency Medicine

## 2024-07-24 ENCOUNTER — Other Ambulatory Visit: Payer: Self-pay

## 2024-07-24 ENCOUNTER — Emergency Department (HOSPITAL_COMMUNITY)

## 2024-07-24 DIAGNOSIS — K921 Melena: Secondary | ICD-10-CM | POA: Insufficient documentation

## 2024-07-24 DIAGNOSIS — R55 Syncope and collapse: Secondary | ICD-10-CM | POA: Diagnosis not present

## 2024-07-24 DIAGNOSIS — G43809 Other migraine, not intractable, without status migrainosus: Secondary | ICD-10-CM | POA: Insufficient documentation

## 2024-07-24 DIAGNOSIS — R195 Other fecal abnormalities: Secondary | ICD-10-CM

## 2024-07-24 DIAGNOSIS — Z87891 Personal history of nicotine dependence: Secondary | ICD-10-CM | POA: Diagnosis not present

## 2024-07-24 DIAGNOSIS — J45909 Unspecified asthma, uncomplicated: Secondary | ICD-10-CM | POA: Diagnosis not present

## 2024-07-24 LAB — COMPREHENSIVE METABOLIC PANEL WITH GFR
ALT: 19 U/L (ref 0–44)
AST: 24 U/L (ref 15–41)
Albumin: 4.2 g/dL (ref 3.5–5.0)
Alkaline Phosphatase: 78 U/L (ref 38–126)
Anion gap: 12 (ref 5–15)
BUN: <5 mg/dL — ABNORMAL LOW (ref 6–20)
CO2: 24 mmol/L (ref 22–32)
Calcium: 8.9 mg/dL (ref 8.9–10.3)
Chloride: 103 mmol/L (ref 98–111)
Creatinine, Ser: 1.23 mg/dL (ref 0.61–1.24)
GFR, Estimated: >60 mL/min (ref >60)
Glucose, Bld: 93 mg/dL (ref 70–99)
Potassium: 4 mmol/L (ref 3.5–5.1)
Sodium: 139 mmol/L (ref 135–145)
Total Bilirubin: 1 mg/dL (ref 0.0–1.2)
Total Protein: 6.8 g/dL (ref 6.5–8.1)

## 2024-07-24 LAB — CBC WITH DIFFERENTIAL/PLATELET
Abs Immature Granulocytes: 0.01 K/uL (ref 0.00–0.07)
Basophils Absolute: 0.1 K/uL (ref 0.0–0.1)
Basophils Relative: 1 %
Eosinophils Absolute: 0.1 K/uL (ref 0.0–0.5)
Eosinophils Relative: 1 %
HCT: 45.4 % (ref 39.0–52.0)
Hemoglobin: 15.8 g/dL (ref 13.0–17.0)
Immature Granulocytes: 0 %
Lymphocytes Relative: 22 %
Lymphs Abs: 1.3 K/uL (ref 0.7–4.0)
MCH: 29.5 pg (ref 26.0–34.0)
MCHC: 34.8 g/dL (ref 30.0–36.0)
MCV: 84.9 fL (ref 80.0–100.0)
Monocytes Absolute: 0.4 K/uL (ref 0.1–1.0)
Monocytes Relative: 7 %
Neutro Abs: 3.9 K/uL (ref 1.7–7.7)
Neutrophils Relative %: 69 %
Platelets: 290 K/uL (ref 150–400)
RBC: 5.35 MIL/uL (ref 4.22–5.81)
RDW: 11.7 % (ref 11.5–15.5)
WBC: 5.6 K/uL (ref 4.0–10.5)
nRBC: 0 % (ref 0.0–0.2)

## 2024-07-24 LAB — LIPASE, BLOOD: Lipase: 25 U/L (ref 11–51)

## 2024-07-24 LAB — TYPE AND SCREEN
ABO/RH(D): O POS
Antibody Screen: NEGATIVE

## 2024-07-24 LAB — POC OCCULT BLOOD, ED: Fecal Occult Bld: NEGATIVE

## 2024-07-24 MED ORDER — DIPHENHYDRAMINE HCL 50 MG/ML IJ SOLN
12.5000 mg | Freq: Once | INTRAMUSCULAR | Status: AC
  Administered 2024-07-24: 12.5 mg via INTRAVENOUS
  Filled 2024-07-24: qty 1

## 2024-07-24 MED ORDER — SODIUM CHLORIDE 0.9 % IV BOLUS
1000.0000 mL | Freq: Once | INTRAVENOUS | Status: AC
Start: 2024-07-24 — End: 2024-07-24
  Administered 2024-07-24: 1000 mL via INTRAVENOUS

## 2024-07-24 MED ORDER — ACETAMINOPHEN 500 MG PO TABS
1000.0000 mg | ORAL_TABLET | Freq: Once | ORAL | Status: AC
  Administered 2024-07-24: 1000 mg via ORAL
  Filled 2024-07-24: qty 2

## 2024-07-24 MED ORDER — BUTALBITAL-APAP-CAFFEINE 50-325-40 MG PO TABS: 1.0000 | ORAL_TABLET | Freq: Four times a day (QID) | ORAL | 0 refills | Status: DC | PRN

## 2024-07-24 MED ORDER — ALUM & MAG HYDROXIDE-SIMETH 200-200-20 MG/5ML PO SUSP
30.0000 mL | Freq: Once | ORAL | Status: AC
  Administered 2024-07-24: 30 mL via ORAL
  Filled 2024-07-24: qty 30

## 2024-07-24 MED ORDER — LIDOCAINE VISCOUS HCL 2 % MT SOLN
15.0000 mL | Freq: Once | OROMUCOSAL | Status: AC
  Administered 2024-07-24: 15 mL via ORAL
  Filled 2024-07-24: qty 15

## 2024-07-24 MED ORDER — PROCHLORPERAZINE EDISYLATE 10 MG/2ML IJ SOLN
10.0000 mg | Freq: Once | INTRAMUSCULAR | Status: AC
  Administered 2024-07-24: 10 mg via INTRAVENOUS
  Filled 2024-07-24: qty 2

## 2024-07-24 MED ORDER — PANTOPRAZOLE SODIUM 20 MG PO TBEC: 20.0000 mg | DELAYED_RELEASE_TABLET | Freq: Every day | ORAL | 0 refills | Status: AC

## 2024-07-24 MED ORDER — ONDANSETRON 4 MG PO TBDP: 4.0000 mg | ORAL_TABLET | Freq: Three times a day (TID) | ORAL | 0 refills | Status: DC | PRN

## 2024-07-24 NOTE — ED Triage Notes (Addendum)
 Pt c/o syncope, dark tarry stools, and abdominal pain x1 day.  Pain score 7/10.   Pt reports he thinks he has a bleeding ulcer.  Denies blood thinners.    Pt reports he was seen recently for a stroke/migraine.

## 2024-07-24 NOTE — Discharge Instructions (Signed)
 We evaluated you for your migraine, episode of fainting and dark stools.  Your testing in the emergency department is reassuring.  We suspect your symptoms are due to a migraine.  Your symptoms improved with treatment in the emergency department.  We have prescribed you a small amount of Fioricet, which is a type of prescription headache medication.  You can take this every 6 hours if needed for migraine, but try to take it only if absolutely necessary.  We have also prescribed you nausea medicine that you can take for headaches at home.  Since you have also been having some stomach pain and dark stools, please avoid taking ibuprofen  or naproxen  We also evaluated you for your dark stools.  Your red blood cell count/hemoglobin count was normal, and your stool testing did not show signs of bleeding today.  It is still possible you are having bleeding or inflammation in your stomach, so we have started you on a medication called Protonix  which is an antiacid medicine.  Please take this daily.  You can follow-up with your primary doctor or with [facility] gastroenterology.  If you have any new or worsening symptoms such as recurrent fainting, severe or uncontrolled headaches, abdominal pain, uncontrolled vomiting, or any other new or worsening symptoms, please return to the emergency department.

## 2024-07-24 NOTE — ED Notes (Signed)
 Got patient into a gown on the monitor did EKG shown to er provider patient is resting with call bell in reach

## 2024-07-24 NOTE — ED Provider Notes (Signed)
 [facility] Provider Note  CSN: [id number] Arrival date & time: 07/24/24 1157  Chief Complaint(s) Loss of Consciousness, Rectal Bleeding, and Abdominal Pain  HPI Brett Willis is a 23 y.o. male history of asthma, presenting to the emergency department with dark stools.  He reports that today he noticed having some dark stool, also having some left upper quadrant abdominal pain.  No nausea or vomiting.  Reports he had a similar episode 5 years ago, was started on some type of medication and resolved.  Has never had an endoscopy has never required blood transfusion.  Patient also reports today had episode of fainting, reports he felt lightheaded and dizzy, and lost consciousness.  Family not sure if he fell, did not hear a thump.  He is not sure if he hit his head.  Afterwards did develop migraine headache on the left side.  Reports was recently in the ER for migraine, felt better but symptoms have gotten worse today.  Reports associated nausea and vomiting, photophobia, phonophobia.  No fevers or chills.   Past Medical History Past Medical History:  Diagnosis Date   Asthma    Patient Active Problem List   Diagnosis Date Noted   Dark stools 11/23/2018   Poor concentration 08/07/2018   Frequent headaches 01/10/2016   History of multiple concussions 01/10/2016   Anxiety state 01/10/2016   Home Medication(s) Prior to Admission medications   Medication Sig Start Date End Date Taking? Authorizing Provider  butalbital-acetaminophen -caffeine (FIORICET) 50-325-40 MG tablet Take 1 tablet by mouth every 6 (six) hours as needed for headache. 07/24/24  Yes Francesca Elsie CROME, MD  ondansetron (ZOFRAN-ODT) 4 MG disintegrating tablet Take 1 tablet (4 mg total) by mouth every 8 (eight) hours as needed for nausea or vomiting. 07/24/24  Yes Francesca Elsie CROME, MD  pantoprazole (PROTONIX) 20 MG tablet Take 1 tablet (20 mg total) by mouth daily. 07/24/24  Yes  Francesca Elsie CROME, MD  amphetamine -dextroamphetamine (ADDERALL XR) 10 MG 24 hr capsule Take 1 capsule (10 mg total) by mouth daily. Take it in a.m. 08/07/18   Corinthia Blossom, MD  diclofenac  Sodium (VOLTAREN ) 1 % GEL Apply 2 g topically 4 (four) times daily. 09/13/20   Edelmiro Leash, MD  dicyclomine  (BENTYL ) 10 MG capsule Take 1 capsule (10 mg total) by mouth 4 (four) times daily -  before meals and at bedtime for 5 days. 05/31/20 06/05/20  Angelena Smalls, MD  metoCLOPramide  (REGLAN ) 10 MG tablet Take 1 tablet (10 mg total) by mouth every 8 (eight) hours as needed (headache and/or nausea). 05/31/20 05/31/21  Angelena Smalls, MD                                                                                                                                    Past Surgical History Past Surgical History:  Procedure Laterality Date   CIRCUMCISION     Family History  Family History  Problem Relation Age of Onset   Migraines Mother    Depression Father    Migraines Maternal Grandfather     Social History Social History   Tobacco Use   Smoking status: Former   Smokeless tobacco: Never  [employer]   Vaping status: Every Day  Substance Use Topics   Alcohol use: No   Drug use: Yes    Types: Marijuana   Allergies Patient has no known allergies.  Review of Systems Review of Systems  All other systems reviewed and are negative.   Physical Exam Vital Signs  I have reviewed the triage vital signs BP (!) 135/90 (BP Location: Right Arm)   Pulse 81   Temp 98.2 F (36.8 C)   Resp 18   Ht 5' 8 (1.727 m)   Wt 81 kg   SpO2 95%   BMI 27.15 kg/m  Physical Exam Vitals and nursing note reviewed.  Constitutional:      General: He is not in acute distress.    Appearance: Normal appearance.  HENT:     Mouth/Throat:     Mouth: Mucous membranes are moist.  Eyes:     Conjunctiva/sclera: Conjunctivae normal.  Cardiovascular:     Rate and Rhythm: Normal rate and regular rhythm.   Pulmonary:     Effort: Pulmonary effort is normal. No respiratory distress.     Breath sounds: Normal breath sounds.  Abdominal:     General: Abdomen is flat.     Palpations: Abdomen is soft.     Tenderness: There is no abdominal tenderness.  Genitourinary:    Comments: Rectal exam chaperoned by RN, brown stool present Musculoskeletal:     Right lower leg: No edema.     Left lower leg: No edema.  Skin:    General: Skin is warm and dry.     Capillary Refill: Capillary refill takes less than 2 seconds.  Neurological:     Mental Status: He is alert and oriented to person, place, and time. Mental status is at baseline.     Comments: Cranial nerves II through XII intact, strength 5 out of 5 in the bilateral upper and lower extremities, no sensory deficit to light touch, no dysmetria on finger-nose-finger testing, ambulatory with steady gait.  Psychiatric:        Mood and Affect: Mood normal.        Behavior: Behavior normal.     ED Results and Treatments Labs (all labs ordered are listed, but only abnormal results are displayed) Labs Reviewed  COMPREHENSIVE METABOLIC PANEL WITH GFR - Abnormal; Notable for the following components:      Result Value   BUN <5 (*)    All other components within normal limits  CBC WITH DIFFERENTIAL/PLATELET  LIPASE, BLOOD  POC OCCULT BLOOD, ED  TYPE AND SCREEN  Radiology CT Head Wo Contrast Result Date: 07/24/2024 CLINICAL DATA:  Headache and fever. EXAM: CT HEAD WITHOUT CONTRAST TECHNIQUE: Contiguous axial images were obtained from the base of the skull through the vertex without intravenous contrast. RADIATION DOSE REDUCTION: This exam was performed according to the departmental dose-optimization program which includes automated exposure control, adjustment of the mA and/or kV according to patient size and/or use of iterative  reconstruction technique. COMPARISON:  07/22/2024. FINDINGS: Brain: No evidence of acute infarction, hemorrhage, hydrocephalus, extra-axial collection or mass lesion/mass effect. Vascular: No hyperdense vessel or unexpected calcification. Skull: Normal. Negative for fracture or focal lesion. Sinuses/Orbits: Globes and orbits are unremarkable. Sinuses are clear. Other: None. IMPRESSION: 1. Normal unenhanced CT scan of the brain. Electronically Signed   By: Alm Parkins M.D.   On: 07/24/2024 13:57    Pertinent labs & imaging results that were available during my care of the patient were reviewed by me and considered in my medical decision making (see MDM for details).  Medications Ordered in ED Medications  sodium chloride  0.9 % bolus 1,000 mL (0 mLs Intravenous Stopped 07/24/24 1400)  prochlorperazine  (COMPAZINE ) injection 10 mg (10 mg Intravenous Given 07/24/24 1242)  diphenhydrAMINE  (BENADRYL ) injection 12.5 mg (12.5 mg Intravenous Given 07/24/24 1242)  acetaminophen  (TYLENOL ) tablet 1,000 mg (1,000 mg Oral Given 07/24/24 1242)  alum & mag hydroxide-simeth (MAALOX/MYLANTA) 200-200-20 MG/5ML suspension 30 mL (30 mLs Oral Given 07/24/24 1251)    And  lidocaine  (XYLOCAINE ) 2 % viscous mouth solution 15 mL (15 mLs Oral Given 07/24/24 1251)                                                                                                                                     Procedures Procedures  (including critical care time)  Medical Decision Making / ED Course   MDM:  23 year old presenting to the emergency department dark stool, migraine.  Patient overall well-appearing, physical examination with brown stool on rectal exam as well as normal neurologic exam.  Differential cause of reported dark stool and left upper quadrant pain include peptic ulcer disease, gastritis, GERD.  Concern for dangerous process such as perforation, gastric volvulus, pancreatitis, cholecystitis overall very low  with no abdominal tenderness and minimal pain.  He has a history of similar and was started on PPI, reviewed prior pediatric GI note.  Send occult stool.  Patient not on anticoagulation.  Will check hemoglobin as well as labs.  Patient also reporting episode of syncope after feeling lightheadedness, subsequently developing headache.  Symptoms seem pretty typical for a migraine and his neurologic examination is normal.  Differential also includes other process such as SAH, seizure, vasovagal syncope.  Will give migraine cocktail.  Given syncope and headache as well as possibility of head trauma will check CT head although concern for dangerous intracranial process is very low.  Doubt stroke with normal neurologic examination.  Will reassess.  EKG is reassuring.  If workup is reassuring do not think patient will need to be admitted for syncope workup.  Clinical Course as of 07/24/24 1450  Sat Jul 24, 2024  1240 Fecal Occult Blood, POC: NEGATIVE [WS]  1449 Workup is reassuring, occult blood testing negative, hemoglobin is normal.  CT head was without acute process.  He received migraine cocktail which resolved his headache.  He received GI cocktail which resolved his abdominal pain.  At this point, feel patient is stable for discharge.  Recommended follow-up with neurology as planned.  Discussed also can follow-up with gastroenterology versus primary doctor for upper abdominal pain and dark stools, recommended to start Protonix.  He reports similar episode when he was started on PPI and this helped. Will discharge patient to home. All questions answered. Patient comfortable with plan of discharge. Return precautions discussed with patient and specified on the after visit summary.  [WS]    Clinical Course User Index [WS] Francesca, Elsie CROME, MD     Additional history obtained: -Additional history obtained from family -External records from outside source obtained and reviewed including: Chart review  including previous notes, labs, imaging, consultation notes including prior ER visit    Lab Tests: -I ordered, reviewed, and interpreted labs.   The pertinent results include:   Labs Reviewed  COMPREHENSIVE METABOLIC PANEL WITH GFR - Abnormal; Notable for the following components:      Result Value   BUN <5 (*)    All other components within normal limits  CBC WITH DIFFERENTIAL/PLATELET  LIPASE, BLOOD  POC OCCULT BLOOD, ED  TYPE AND SCREEN    Notable for normal hgb  EKG   EKG Interpretation Date/Time:  Saturday July 24 2024 12:10:03 EST Ventricular Rate:  90 PR Interval:  156 QRS Duration:  114 QT Interval:  374 QTC Calculation: 458 R Axis:   -72  Text Interpretation: Sinus rhythm Incomplete right bundle branch block No significant change since last tracing Confirmed by Francesca Elsie ([id number]) on 07/24/2024 12:32:47 PM         Imaging Studies ordered: I ordered imaging studies including CT head  On my interpretation imaging demonstrates no acute process I independently visualized and interpreted imaging. I agree with the radiologist interpretation   Medicines ordered and prescription drug management: Meds ordered this encounter  Medications   sodium chloride  0.9 % bolus 1,000 mL   prochlorperazine  (COMPAZINE ) injection 10 mg   diphenhydrAMINE  (BENADRYL ) injection 12.5 mg   acetaminophen  (TYLENOL ) tablet 1,000 mg   AND Linked Order Group    alum & mag hydroxide-simeth (MAALOX/MYLANTA) 200-200-20 MG/5ML suspension 30 mL    lidocaine  (XYLOCAINE ) 2 % viscous mouth solution 15 mL   ondansetron (ZOFRAN-ODT) 4 MG disintegrating tablet    Sig: Take 1 tablet (4 mg total) by mouth every 8 (eight) hours as needed for nausea or vomiting.    Dispense:  20 tablet    Refill:  0   butalbital-acetaminophen -caffeine (FIORICET) 50-325-40 MG tablet    Sig: Take 1 tablet by mouth every 6 (six) hours as needed for headache.    Dispense:  10 tablet    Refill:  0    pantoprazole (PROTONIX) 20 MG tablet    Sig: Take 1 tablet (20 mg total) by mouth daily.    Dispense:  30 tablet    Refill:  0    -I have reviewed the patients home medicines and have made adjustments as needed    Reevaluation: After the interventions noted above, I reevaluated the patient and  found that their symptoms have improved  Co morbidities that complicate the patient evaluation  Past Medical History:  Diagnosis Date   Asthma       Dispostion: Disposition decision including need for hospitalization was considered, and patient discharged from emergency department.    Final Clinical Impression(s) / ED Diagnoses Final diagnoses:  Other migraine without status migrainosus, not intractable  Syncope, unspecified syncope type  Dark stools     This chart was dictated using voice recognition software.  Despite best efforts to proofread,  errors can occur which can change the documentation meaning.    Francesca Elsie CROME, MD 07/24/24 1450

## 2024-07-26 ENCOUNTER — Encounter: Payer: Self-pay | Admitting: Neurology

## 2024-07-26 NOTE — Progress Notes (Unsigned)
 NEUROLOGY CONSULTATION NOTE  Brett Willis MRN: [id number] DOB: 12/06/2000  Referring provider: Elsie Body, MD *** Primary care provider: ***  Reason for consult:  headache  Assessment/Plan:   ***   Subjective:  Brett Willis is a 23 year old male who presents for migraines.  History supplemented by ED notes.  Onset:  *** Location:  *** Quality:  *** Intensity:  ***.  Aura:  *** Prodrome:  *** Postdrome:  *** Associated symptoms:  ***.  *** denies associated unilateral numbness or weakness. Duration:  *** Frequency:  *** Frequency of abortive medication: *** Triggers:  *** Relieving factors:  *** Activity:  ***  Past NSAIDS/analgesics:  *** Past abortive triptans:  *** Past abortive ergotamine:  *** Past muscle relaxants:  *** Past anti-emetic:  Reglan  Past antihypertensive medications:  *** Past antidepressant medications:  *** Past anticonvulsant medications:  *** Past anti-CGRP:  *** Past vitamins/Herbal/Supplements:  *** Past antihistamines/decongestants:  *** Other past therapies:  ***  Current NSAIDS/analgesics:  Fioricet Current triptans:  *** Current ergotamine:  *** Current anti-emetic:  Zofran -ODT 4mg  Current muscle relaxants:  *** Current Antihypertensive medications:  *** Current Antidepressant medications:  *** Current Anticonvulsant medications:  *** Current anti-CGRP:  *** Current Vitamins/Herbal/Supplements:  *** Current Antihistamines/Decongestants:  *** Other therapy:  *** Other medications:  Adderall   Caffeine :  *** Alcohol:  *** Smoker:  *** Diet:  *** Exercise:  *** Depression:  ***; Anxiety:  *** Other pain:  *** Sleep hygiene:  ***  History of TBI/concussion:  *** Family history of headache:  *** Family history of cerebral aneurysm:  ***      PAST MEDICAL HISTORY: Past Medical History:  Diagnosis Date   Asthma     PAST SURGICAL HISTORY: Past Surgical History:  Procedure Laterality Date   CIRCUMCISION       MEDICATIONS: Current Outpatient Medications on File Prior to Visit  Medication Sig Dispense Refill   amphetamine -dextroamphetamine (ADDERALL XR) 10 MG 24 hr capsule Take 1 capsule (10 mg total) by mouth daily. Take it in a.m. 30 capsule 0   butalbital -acetaminophen -caffeine  (FIORICET) 50-325-40 MG tablet Take 1 tablet by mouth every 6 (six) hours as needed for headache. 10 tablet 0   diclofenac  Sodium (VOLTAREN ) 1 % GEL Apply 2 g topically 4 (four) times daily. 100 g 0   dicyclomine  (BENTYL ) 10 MG capsule Take 1 capsule (10 mg total) by mouth 4 (four) times daily -  before meals and at bedtime for 5 days. 20 capsule 0   metoCLOPramide  (REGLAN ) 10 MG tablet Take 1 tablet (10 mg total) by mouth every 8 (eight) hours as needed (headache and/or nausea). 12 tablet 0   ondansetron  (ZOFRAN -ODT) 4 MG disintegrating tablet Take 1 tablet (4 mg total) by mouth every 8 (eight) hours as needed for nausea or vomiting. 20 tablet 0   pantoprazole  (PROTONIX ) 20 MG tablet Take 1 tablet (20 mg total) by mouth daily. 30 tablet 0   No current facility-administered medications on file prior to visit.    ALLERGIES: No Known Allergies  FAMILY HISTORY: Family History  Problem Relation Age of Onset   Migraines Mother    Depression Father    Migraines Maternal Grandfather     Objective:  *** General: No acute distress.  Patient appears well-groomed.   Head:  Normocephalic/atraumatic Eyes:  fundi examined but not visualized Neck: supple, no paraspinal tenderness, full range of motion Heart: regular rate and rhythm Neurological Exam: Mental status: alert and oriented to person,  place, and time, speech fluent and not dysarthric, language intact. Cranial nerves: CN I: not tested CN II: pupils equal, round and reactive to light, visual fields intact CN III, IV, VI:  full range of motion, no nystagmus, no ptosis CN V: facial sensation intact. CN VII: upper and lower face symmetric CN VIII: hearing  intact CN IX, X: gag intact, uvula midline CN XI: sternocleidomastoid and trapezius muscles intact CN XII: tongue midline Bulk & Tone: normal, no fasciculations. Motor:  muscle strength 5/5 throughout Sensation:  Pinprick and vibratory sensation intact. Deep Tendon Reflexes:  2+ throughout,  toes downgoing.   Finger to nose testing:  Without dysmetria.   Gait:  Normal station and stride.  Romberg negative.    Thank you for allowing me to take part in the care of this patient.  Juliene Dunnings, DO  CC: ***

## 2024-07-27 ENCOUNTER — Ambulatory Visit: Admitting: Neurology

## 2024-07-27 ENCOUNTER — Encounter: Payer: Self-pay | Admitting: Neurology

## 2024-07-27 VITALS — BP 123/80 | HR 85 | Ht 68.0 in | Wt 176.2 lb

## 2024-07-27 DIAGNOSIS — G43111 Migraine with aura, intractable, with status migrainosus: Secondary | ICD-10-CM | POA: Diagnosis not present

## 2024-07-27 MED ORDER — TOPIRAMATE 50 MG PO TABS: 50.0000 mg | ORAL_TABLET | Freq: Every day | ORAL | 5 refills | Status: AC

## 2024-07-27 MED ORDER — ONDANSETRON 4 MG PO TBDP: 4.0000 mg | ORAL_TABLET | Freq: Three times a day (TID) | ORAL | 5 refills | Status: AC | PRN

## 2024-07-27 MED ORDER — PREDNISONE 10 MG PO TABS: ORAL_TABLET | ORAL | 0 refills | Status: AC

## 2024-07-27 NOTE — Patient Instructions (Addendum)
  MRI of brain with and without contrast. We have sent a referral to [facility] for your MRI and they will call you directly to schedule your appointment. They are located at [address] [facility]. If you need to contact them directly please call [phone].  Start PREDNISONE TAPER as prescribed.  Consider taking prilosic during taper to protect your stomach Start TOPIRAMATE 50MG  - TAKE 1/2 TABLET AT BEDTIME FOR ONE WEEK, THEN INCREASE TO 1 TABLET AT BEDTIME.  Contact us  in 5 weeks with update and we can increase dose if needed. Take NURTEC at earliest onset of headache.  Maximum 1 tablet in 24 hours.  Let me know if it works or not.  Stop Excedrin, Tylenol  and don't pick up the butalbital medication from the ED Take ONDANSETRON for nausea Limit use of pain relievers to no more than 9 days out of the month.  These medications include acetaminophen , NSAIDs (ibuprofen /Advil /Motrin , naproxen/Aleve, triptans (Imitrex/sumatriptan), Excedrin, and narcotics.  This will help reduce risk of rebound headaches. Be aware of common food triggers:  - Caffeine:  coffee, black tea, cola, Mt. Dew  - Chocolate  - Dairy:  aged cheeses (brie, blue, cheddar, gouda, Parmasan, provolone, romano, Swiss, etc), chocolate milk, buttermilk, sour cream, limit eggs and yogurt  - Nuts, peanut butter  - Alcohol  - Cereals/grains:  FRESH breads (fresh bagels, sourdough, doughnuts), yeast productions  - Processed/canned/aged/cured meats (pre-packaged deli meats, hotdogs)  - MSG/glutamate:  soy sauce, flavor enhancer, pickled/preserved/marinated foods  - Sweeteners:  aspartame (Equal, Nutrasweet).  Sugar and Splenda are okay  - Vegetables:  legumes (lima beans, lentils, snow peas, fava beans, pinto peans, peas, garbanzo beans), sauerkraut, onions, olives, pickles  - Fruit:  avocados, bananas, citrus fruit (orange, lemon, grapefruit), mango  - Other:  Frozen meals, macaroni and cheese Routine exercise Stay adequately  hydrated (aim for 64 oz water daily) Keep headache diary Maintain proper stress management Maintain proper sleep hygiene Do not skip meals Consider supplements:  magnesium citrate 400mg  daily, riboflavin 400mg  daily, coenzyme Q10 100mg  three times daily.  [place] law states no driving for 6 months after losing consciousness Would remain off of ladders for 3 months.

## 2024-07-27 NOTE — Progress Notes (Signed)
 Medication Samples have been provided to the patient.  Drug name: Nurtec       Strength: 75 mg        Qty: 2  LOT: [id number]  Exp.Date: 11/28  Dosing instructions: as needed  The patient has been instructed regarding the correct time, dose, and frequency of taking this medication, including desired effects and most common side effects.   Brett Willis 1:37 PM 07/27/2024

## 2024-08-27 ENCOUNTER — Inpatient Hospital Stay: Admission: RE | Admit: 2024-08-27 | Source: Ambulatory Visit

## 2024-11-11 ENCOUNTER — Ambulatory Visit: Admitting: Neurology

## 2025-01-26 ENCOUNTER — Ambulatory Visit: Admitting: Neurology
